# Patient Record
Sex: Female | Born: 1970 | Race: Black or African American | Hispanic: No | Smoking: Never smoker
Health system: Southern US, Community
[De-identification: ages and names within clinical notes are randomized; demographics above are authoritative.]

## PROBLEM LIST (undated history)

## (undated) DIAGNOSIS — I10 Essential (primary) hypertension: Secondary | ICD-10-CM

## (undated) DIAGNOSIS — E785 Hyperlipidemia, unspecified: Secondary | ICD-10-CM

## (undated) DIAGNOSIS — D573 Sickle-cell trait: Secondary | ICD-10-CM

## (undated) DIAGNOSIS — M1990 Unspecified osteoarthritis, unspecified site: Secondary | ICD-10-CM

## (undated) DIAGNOSIS — R011 Cardiac murmur, unspecified: Secondary | ICD-10-CM

## (undated) HISTORY — DX: Cardiac murmur, unspecified: R01.1

## (undated) HISTORY — DX: Hyperlipidemia, unspecified: E78.5

## (undated) HISTORY — DX: Unspecified osteoarthritis, unspecified site: M19.90

---

## 1998-08-07 ENCOUNTER — Emergency Department (HOSPITAL_COMMUNITY): Admission: EM | Admit: 1998-08-07 | Discharge: 1998-08-07 | Payer: Self-pay | Admitting: Emergency Medicine

## 1998-09-05 ENCOUNTER — Other Ambulatory Visit: Admission: RE | Admit: 1998-09-05 | Discharge: 1998-09-05 | Payer: Self-pay | Admitting: *Deleted

## 1999-09-17 ENCOUNTER — Other Ambulatory Visit: Admission: RE | Admit: 1999-09-17 | Discharge: 1999-09-17 | Payer: Self-pay | Admitting: Obstetrics & Gynecology

## 2000-12-16 ENCOUNTER — Other Ambulatory Visit: Admission: RE | Admit: 2000-12-16 | Discharge: 2000-12-16 | Payer: Self-pay | Admitting: Obstetrics and Gynecology

## 2002-01-05 ENCOUNTER — Other Ambulatory Visit: Admission: RE | Admit: 2002-01-05 | Discharge: 2002-01-05 | Payer: Self-pay | Admitting: Obstetrics & Gynecology

## 2003-09-23 ENCOUNTER — Emergency Department (HOSPITAL_COMMUNITY): Admission: EM | Admit: 2003-09-23 | Discharge: 2003-09-23 | Payer: Self-pay | Admitting: *Deleted

## 2003-11-17 ENCOUNTER — Emergency Department (HOSPITAL_COMMUNITY): Admission: EM | Admit: 2003-11-17 | Discharge: 2003-11-17 | Payer: Self-pay | Admitting: Emergency Medicine

## 2004-07-09 ENCOUNTER — Other Ambulatory Visit: Admission: RE | Admit: 2004-07-09 | Discharge: 2004-07-09 | Payer: Self-pay | Admitting: Obstetrics and Gynecology

## 2005-04-09 ENCOUNTER — Emergency Department (HOSPITAL_COMMUNITY): Admission: EM | Admit: 2005-04-09 | Discharge: 2005-04-09 | Payer: Self-pay | Admitting: Emergency Medicine

## 2005-05-10 ENCOUNTER — Encounter: Admission: RE | Admit: 2005-05-10 | Discharge: 2005-05-10 | Payer: Self-pay | Admitting: Family Medicine

## 2005-10-17 ENCOUNTER — Encounter: Admission: RE | Admit: 2005-10-17 | Discharge: 2005-10-17 | Payer: Self-pay | Admitting: Family Medicine

## 2005-11-01 ENCOUNTER — Other Ambulatory Visit: Admission: RE | Admit: 2005-11-01 | Discharge: 2005-11-01 | Payer: Self-pay | Admitting: Obstetrics and Gynecology

## 2008-02-08 ENCOUNTER — Emergency Department (HOSPITAL_COMMUNITY): Admission: EM | Admit: 2008-02-08 | Discharge: 2008-02-08 | Payer: Self-pay | Admitting: Emergency Medicine

## 2008-06-26 ENCOUNTER — Emergency Department (HOSPITAL_COMMUNITY): Admission: EM | Admit: 2008-06-26 | Discharge: 2008-06-26 | Payer: Self-pay | Admitting: Emergency Medicine

## 2010-07-02 ENCOUNTER — Emergency Department (HOSPITAL_COMMUNITY): Admission: EM | Admit: 2010-07-02 | Discharge: 2010-07-02 | Payer: Self-pay | Admitting: Emergency Medicine

## 2011-07-01 LAB — URINALYSIS, ROUTINE W REFLEX MICROSCOPIC
Protein, ur: NEGATIVE
Urobilinogen, UA: 0.2

## 2011-07-01 LAB — RPR: RPR Ser Ql: NONREACTIVE

## 2011-07-01 LAB — WET PREP, GENITAL: Yeast Wet Prep HPF POC: NONE SEEN

## 2011-07-01 LAB — PREGNANCY, URINE: Preg Test, Ur: NEGATIVE

## 2012-08-03 ENCOUNTER — Other Ambulatory Visit: Payer: Self-pay | Admitting: Endocrinology

## 2012-08-03 DIAGNOSIS — E049 Nontoxic goiter, unspecified: Secondary | ICD-10-CM

## 2012-08-14 ENCOUNTER — Ambulatory Visit
Admission: RE | Admit: 2012-08-14 | Discharge: 2012-08-14 | Disposition: A | Discharge status: Home / Self Care | Payer: BC Managed Care – PPO | Source: Ambulatory Visit | Attending: Endocrinology | Admitting: Endocrinology

## 2012-08-14 DIAGNOSIS — E049 Nontoxic goiter, unspecified: Secondary | ICD-10-CM

## 2013-06-23 ENCOUNTER — Other Ambulatory Visit: Payer: Self-pay | Admitting: Endocrinology

## 2013-06-23 DIAGNOSIS — E010 Iodine-deficiency related diffuse (endemic) goiter: Secondary | ICD-10-CM

## 2013-11-25 ENCOUNTER — Other Ambulatory Visit: Payer: Self-pay | Admitting: Obstetrics and Gynecology

## 2013-11-25 DIAGNOSIS — R9230 Dense breasts, unspecified: Secondary | ICD-10-CM

## 2013-11-25 DIAGNOSIS — R922 Inconclusive mammogram: Secondary | ICD-10-CM

## 2013-12-06 ENCOUNTER — Other Ambulatory Visit: Payer: BC Managed Care – PPO

## 2013-12-16 ENCOUNTER — Ambulatory Visit
Admission: RE | Admit: 2013-12-16 | Discharge: 2013-12-16 | Disposition: A | Discharge status: Home / Self Care | Payer: BC Managed Care – PPO | Source: Ambulatory Visit | Attending: Obstetrics and Gynecology | Admitting: Obstetrics and Gynecology

## 2013-12-16 DIAGNOSIS — R922 Inconclusive mammogram: Secondary | ICD-10-CM

## 2013-12-16 DIAGNOSIS — R9230 Dense breasts, unspecified: Secondary | ICD-10-CM

## 2013-12-16 MED ORDER — GADOBENATE DIMEGLUMINE 529 MG/ML IV SOLN
15.0000 mL | Freq: Once | INTRAVENOUS | Status: AC | PRN
  Administered 2013-12-16: 15 mL via INTRAVENOUS

## 2013-12-17 ENCOUNTER — Other Ambulatory Visit: Payer: Self-pay | Admitting: Obstetrics and Gynecology

## 2013-12-17 DIAGNOSIS — R928 Other abnormal and inconclusive findings on diagnostic imaging of breast: Secondary | ICD-10-CM

## 2013-12-20 ENCOUNTER — Ambulatory Visit
Admission: RE | Admit: 2013-12-20 | Discharge: 2013-12-20 | Disposition: A | Discharge status: Home / Self Care | Payer: BC Managed Care – PPO | Source: Ambulatory Visit | Attending: Obstetrics and Gynecology | Admitting: Obstetrics and Gynecology

## 2013-12-20 ENCOUNTER — Other Ambulatory Visit: Payer: Self-pay | Admitting: Obstetrics and Gynecology

## 2013-12-20 DIAGNOSIS — R928 Other abnormal and inconclusive findings on diagnostic imaging of breast: Secondary | ICD-10-CM

## 2014-01-17 ENCOUNTER — Other Ambulatory Visit: Payer: BC Managed Care – PPO

## 2014-01-18 ENCOUNTER — Ambulatory Visit
Admission: RE | Admit: 2014-01-18 | Discharge: 2014-01-18 | Disposition: A | Discharge status: Home / Self Care | Payer: BC Managed Care – PPO | Source: Ambulatory Visit | Attending: Obstetrics and Gynecology | Admitting: Obstetrics and Gynecology

## 2014-01-18 ENCOUNTER — Other Ambulatory Visit (HOSPITAL_COMMUNITY): Payer: Self-pay | Admitting: Diagnostic Radiology

## 2014-01-18 DIAGNOSIS — R928 Other abnormal and inconclusive findings on diagnostic imaging of breast: Secondary | ICD-10-CM

## 2014-01-18 MED ORDER — GADOBENATE DIMEGLUMINE 529 MG/ML IV SOLN
15.0000 mL | Freq: Once | INTRAVENOUS | Status: AC | PRN
  Administered 2014-01-18: 15 mL via INTRAVENOUS

## 2014-09-19 ENCOUNTER — Other Ambulatory Visit (HOSPITAL_COMMUNITY): Payer: Self-pay | Admitting: Cardiology

## 2014-09-19 DIAGNOSIS — R011 Cardiac murmur, unspecified: Secondary | ICD-10-CM

## 2014-09-19 DIAGNOSIS — I159 Secondary hypertension, unspecified: Secondary | ICD-10-CM

## 2014-10-03 ENCOUNTER — Encounter (INDEPENDENT_AMBULATORY_CARE_PROVIDER_SITE_OTHER): Payer: Self-pay

## 2014-10-03 ENCOUNTER — Ambulatory Visit (HOSPITAL_COMMUNITY)
Admission: RE | Admit: 2014-10-03 | Discharge: 2014-10-03 | Disposition: A | Discharge status: Home / Self Care | Payer: BLUE CROSS/BLUE SHIELD | Source: Ambulatory Visit | Attending: Cardiology | Admitting: Cardiology

## 2014-10-03 ENCOUNTER — Other Ambulatory Visit (HOSPITAL_COMMUNITY): Payer: Self-pay | Admitting: Cardiology

## 2014-10-03 DIAGNOSIS — M79672 Pain in left foot: Secondary | ICD-10-CM

## 2014-10-03 DIAGNOSIS — R011 Cardiac murmur, unspecified: Secondary | ICD-10-CM

## 2014-10-03 DIAGNOSIS — R0600 Dyspnea, unspecified: Secondary | ICD-10-CM | POA: Insufficient documentation

## 2014-10-03 LAB — COMPREHENSIVE METABOLIC PANEL
ALBUMIN: 4.1 g/dL (ref 3.5–5.2)
ALT: 12 U/L (ref 0–35)
ANION GAP: 7 (ref 5–15)
AST: 15 U/L (ref 0–37)
Alkaline Phosphatase: 60 U/L (ref 39–117)
BUN: 7 mg/dL (ref 6–23)
CALCIUM: 9 mg/dL (ref 8.4–10.5)
CO2: 27 mmol/L (ref 19–32)
CREATININE: 0.73 mg/dL (ref 0.50–1.10)
Chloride: 105 mEq/L (ref 96–112)
GFR calc Af Amer: >90 mL/min (ref >90)
Glucose, Bld: 95 mg/dL (ref 70–99)
Potassium: 3.6 mmol/L (ref 3.5–5.1)
Sodium: 139 mmol/L (ref 135–145)
Total Bilirubin: 0.9 mg/dL (ref 0.3–1.2)
Total Protein: 7.2 g/dL (ref 6.0–8.3)

## 2014-10-03 LAB — LIPID PANEL
Cholesterol: 229 mg/dL — ABNORMAL HIGH (ref 0–200)
HDL: 76 mg/dL (ref >39)
LDL Cholesterol: 143 mg/dL — ABNORMAL HIGH (ref 0–99)
TRIGLYCERIDES: 51 mg/dL (ref <150)
Total CHOL/HDL Ratio: 3 RATIO
VLDL: 10 mg/dL (ref 0–40)

## 2014-10-03 LAB — CBC
HEMATOCRIT: 36.8 % (ref 36.0–46.0)
Hemoglobin: 12.3 g/dL (ref 12.0–15.0)
MCH: 28.5 pg (ref 26.0–34.0)
MCHC: 33.4 g/dL (ref 30.0–36.0)
MCV: 85.4 fL (ref 78.0–100.0)
Platelets: 309 10*3/uL (ref 150–400)
RBC: 4.31 MIL/uL (ref 3.87–5.11)
RDW: 13.4 % (ref 11.5–15.5)
WBC: 5.4 10*3/uL (ref 4.0–10.5)

## 2014-10-03 LAB — URINALYSIS, ROUTINE W REFLEX MICROSCOPIC
Bilirubin Urine: NEGATIVE
GLUCOSE, UA: NEGATIVE mg/dL
Hgb urine dipstick: NEGATIVE
Ketones, ur: NEGATIVE mg/dL
LEUKOCYTES UA: NEGATIVE
NITRITE: NEGATIVE
Protein, ur: NEGATIVE mg/dL
Specific Gravity, Urine: 1.014 (ref 1.005–1.030)
UROBILINOGEN UA: 0.2 mg/dL (ref 0.0–1.0)
pH: 6 (ref 5.0–8.0)

## 2014-10-03 LAB — TSH: TSH: 0.744 u[IU]/mL (ref 0.350–4.500)

## 2014-10-03 NOTE — Progress Notes (Signed)
  Echocardiogram 2D Echocardiogram has been performed.  Elysha Daw FRANCES 10/03/2014, 10:46 AM

## 2014-10-04 LAB — VITAMIN D 25 HYDROXY (VIT D DEFICIENCY, FRACTURES): VIT D 25 HYDROXY: 16.7 ng/mL — AB (ref 30.0–100.0)

## 2014-10-05 LAB — HEMOGLOBIN A1C
Hgb A1c MFr Bld: 6 % — ABNORMAL HIGH (ref <5.7)
Mean Plasma Glucose: 126 mg/dL — ABNORMAL HIGH (ref <117)

## 2014-10-05 LAB — MICROALBUMIN, URINE: MICROALB UR: HIGH mg/dL (ref 0.00–1.89)

## 2014-10-05 LAB — T4: T4, Total: 7.8 ug/dL (ref 4.5–12.0)

## 2014-12-21 ENCOUNTER — Other Ambulatory Visit: Payer: Self-pay | Admitting: Endocrinology

## 2014-12-21 DIAGNOSIS — E049 Nontoxic goiter, unspecified: Secondary | ICD-10-CM

## 2014-12-23 ENCOUNTER — Other Ambulatory Visit: Payer: BLUE CROSS/BLUE SHIELD

## 2014-12-27 ENCOUNTER — Ambulatory Visit
Admission: RE | Admit: 2014-12-27 | Discharge: 2014-12-27 | Disposition: A | Discharge status: Home / Self Care | Payer: BLUE CROSS/BLUE SHIELD | Source: Ambulatory Visit | Attending: Endocrinology | Admitting: Endocrinology

## 2014-12-27 DIAGNOSIS — E049 Nontoxic goiter, unspecified: Secondary | ICD-10-CM

## 2015-01-11 ENCOUNTER — Other Ambulatory Visit: Payer: Self-pay | Admitting: Obstetrics and Gynecology

## 2015-01-13 LAB — CYTOLOGY - PAP

## 2015-12-28 IMAGING — CR DG FOOT COMPLETE 3+V*L*
3 series · 3 of 3 positions shown · non-contrast
Comparison: None.

CLINICAL DATA: Anterior and anterior lateral foot pain for 1 year,
progressive.

EXAM:
LEFT FOOT - COMPLETE 3+ VIEW

[foot ap]
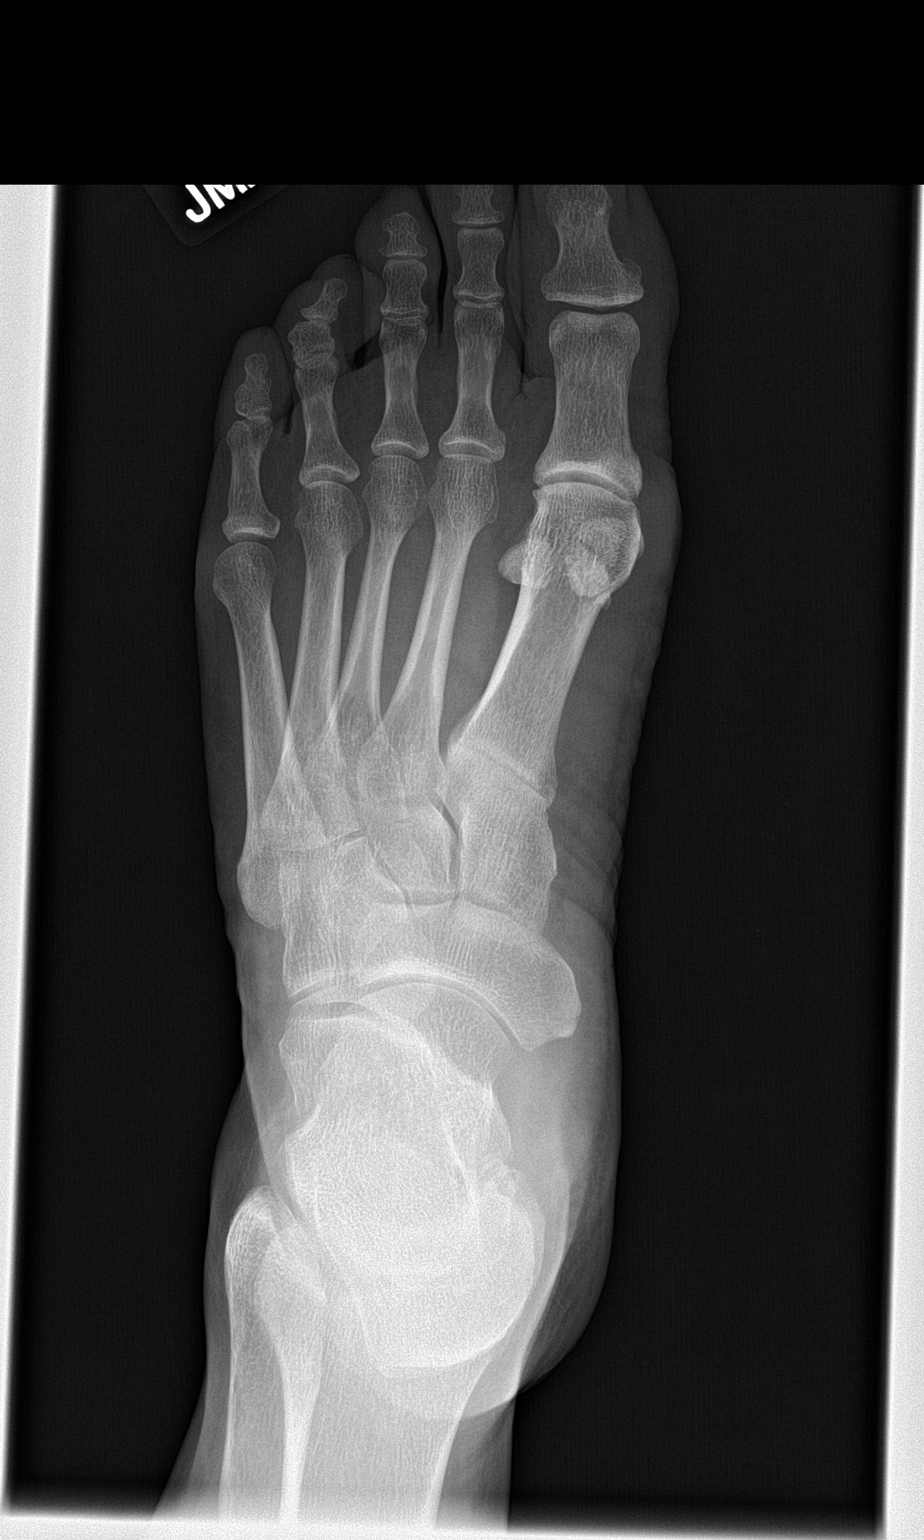

[foot obl]
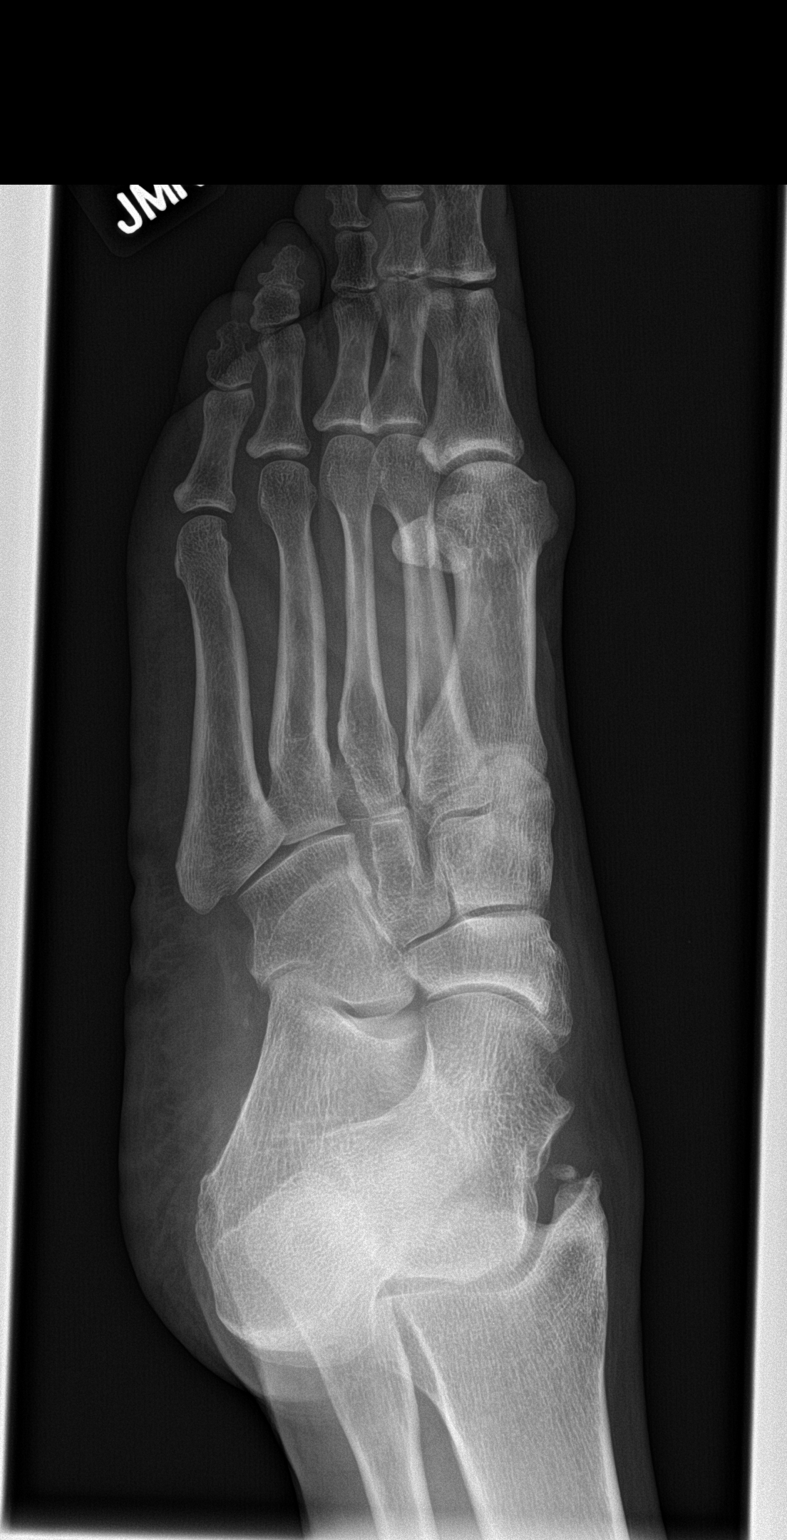

[foot lat]
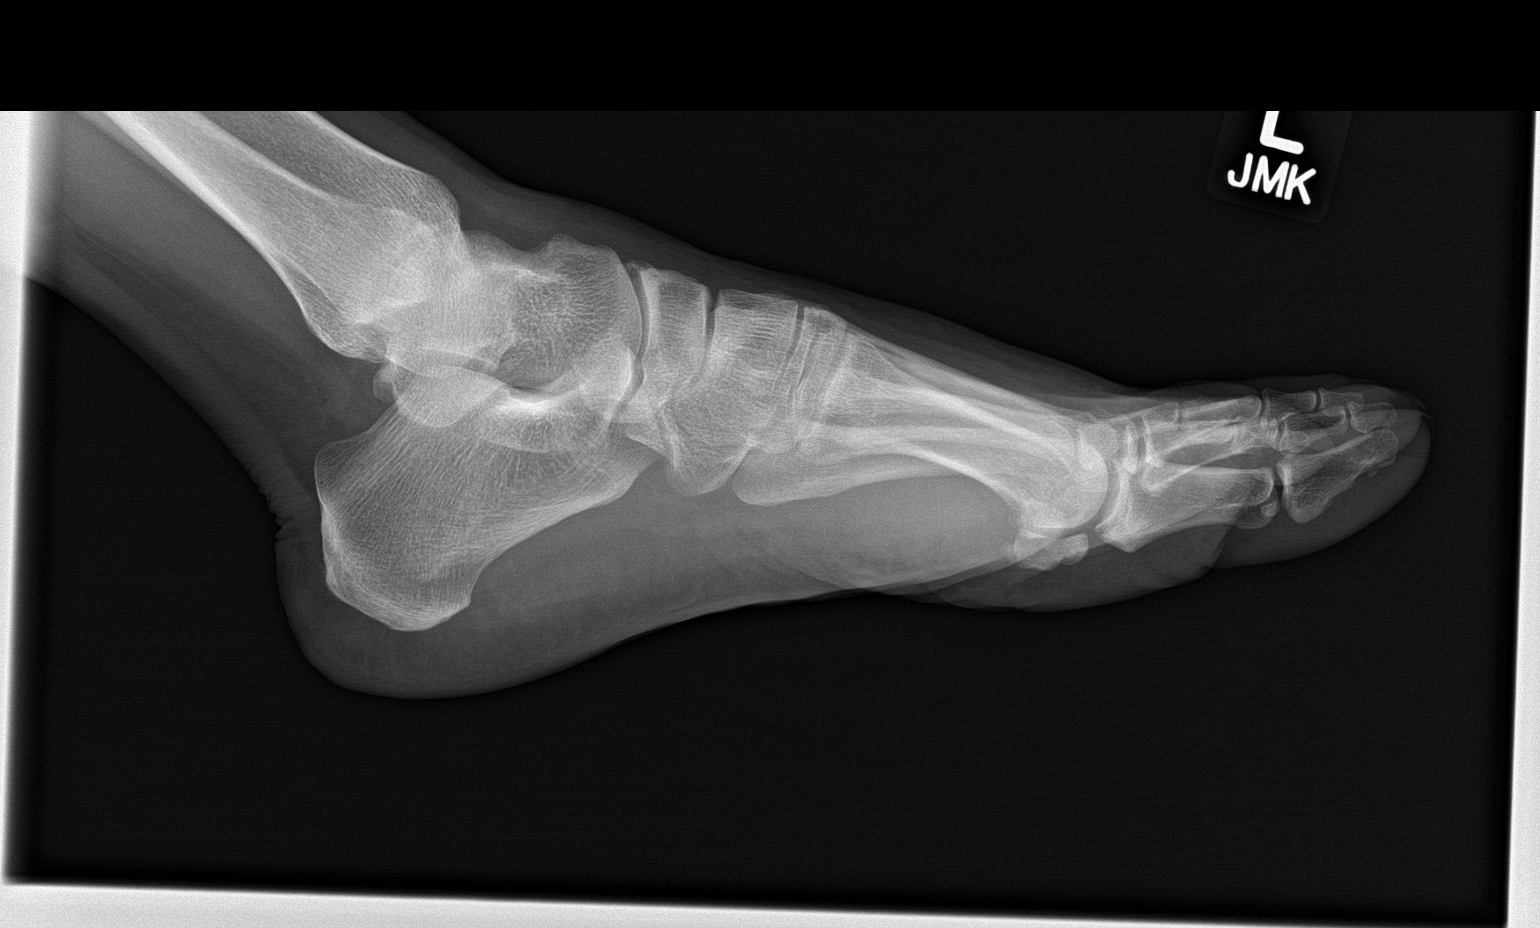

[3 of 3 positions shown; findings below may reference images not displayed]

FINDINGS: There is no fracture or dislocation. There is minimal dorsal
spurring on the distal talus and on the navicular. There is bunion
formation on the head of the first metatarsal with slight
degenerative changes at the first metatarsal phalangeal joint.
IMPRESSION: Minimal degenerative changes.

## 2016-10-02 ENCOUNTER — Other Ambulatory Visit: Payer: Self-pay | Admitting: Endocrinology

## 2016-10-02 DIAGNOSIS — E049 Nontoxic goiter, unspecified: Secondary | ICD-10-CM

## 2016-10-04 ENCOUNTER — Ambulatory Visit
Admission: RE | Admit: 2016-10-04 | Discharge: 2016-10-04 | Disposition: A | Discharge status: Home / Self Care | Payer: BLUE CROSS/BLUE SHIELD | Source: Ambulatory Visit | Attending: Endocrinology | Admitting: Endocrinology

## 2016-10-04 DIAGNOSIS — E049 Nontoxic goiter, unspecified: Secondary | ICD-10-CM

## 2017-12-05 ENCOUNTER — Emergency Department (HOSPITAL_COMMUNITY): Payer: BLUE CROSS/BLUE SHIELD

## 2017-12-05 ENCOUNTER — Emergency Department (HOSPITAL_COMMUNITY)
Admission: EM | Admit: 2017-12-05 | Discharge: 2017-12-05 | Disposition: A | Discharge status: Home / Self Care | Payer: BLUE CROSS/BLUE SHIELD | Attending: Emergency Medicine | Admitting: Emergency Medicine

## 2017-12-05 ENCOUNTER — Encounter (HOSPITAL_COMMUNITY): Payer: Self-pay

## 2017-12-05 DIAGNOSIS — R079 Chest pain, unspecified: Secondary | ICD-10-CM | POA: Diagnosis present

## 2017-12-05 DIAGNOSIS — Z79899 Other long term (current) drug therapy: Secondary | ICD-10-CM | POA: Diagnosis not present

## 2017-12-05 DIAGNOSIS — R0789 Other chest pain: Secondary | ICD-10-CM

## 2017-12-05 DIAGNOSIS — I10 Essential (primary) hypertension: Secondary | ICD-10-CM | POA: Diagnosis not present

## 2017-12-05 HISTORY — DX: Essential (primary) hypertension: I10

## 2017-12-05 HISTORY — DX: Sickle-cell trait: D57.3

## 2017-12-05 LAB — BASIC METABOLIC PANEL
Anion gap: 8 (ref 5–15)
BUN: 12 mg/dL (ref 6–20)
CO2: 25 mmol/L (ref 22–32)
Calcium: 9 mg/dL (ref 8.9–10.3)
Chloride: 105 mmol/L (ref 101–111)
Creatinine, Ser: 0.67 mg/dL (ref 0.44–1.00)
GFR calc Af Amer: >60 mL/min (ref >60)
GFR calc non Af Amer: >60 mL/min (ref >60)
Glucose, Bld: 93 mg/dL (ref 65–99)
Potassium: 3.6 mmol/L (ref 3.5–5.1)
Sodium: 138 mmol/L (ref 135–145)

## 2017-12-05 LAB — I-STAT BETA HCG BLOOD, ED (MC, WL, AP ONLY): I-stat hCG, quantitative: <5 m[IU]/mL (ref <5)

## 2017-12-05 LAB — CBC
HCT: 36.5 % (ref 36.0–46.0)
HEMOGLOBIN: 12.3 g/dL (ref 12.0–15.0)
MCH: 29.4 pg (ref 26.0–34.0)
MCHC: 33.7 g/dL (ref 30.0–36.0)
MCV: 87.1 fL (ref 78.0–100.0)
PLATELETS: 359 10*3/uL (ref 150–400)
RBC: 4.19 MIL/uL (ref 3.87–5.11)
RDW: 13.1 % (ref 11.5–15.5)
WBC: 4.9 10*3/uL (ref 4.0–10.5)

## 2017-12-05 LAB — I-STAT TROPONIN, ED: Troponin i, poc: 0 ng/mL (ref 0.00–0.08)

## 2017-12-05 LAB — D-DIMER, QUANTITATIVE: D-Dimer, Quant: 1.05 ug/mL-FEU — ABNORMAL HIGH (ref 0.00–0.50)

## 2017-12-05 MED ORDER — SODIUM CHLORIDE 0.9 % IJ SOLN
INTRAMUSCULAR | Status: AC
  Filled 2017-12-05: qty 50

## 2017-12-05 MED ORDER — HYDROCODONE-ACETAMINOPHEN 5-325 MG PO TABS
1.0000 | ORAL_TABLET | Freq: Once | ORAL | Status: DC
  Filled 2017-12-05: qty 1

## 2017-12-05 MED ORDER — ASPIRIN 81 MG PO CHEW
324.0000 mg | CHEWABLE_TABLET | Freq: Once | ORAL | Status: AC
  Administered 2017-12-05: 324 mg via ORAL
  Filled 2017-12-05: qty 4

## 2017-12-05 MED ORDER — BACLOFEN 10 MG PO TABS: 10.0000 mg | ORAL_TABLET | Freq: Three times a day (TID) | ORAL | 0 refills | Status: AC | PRN

## 2017-12-05 MED ORDER — MELOXICAM 15 MG PO TABS: 15.0000 mg | ORAL_TABLET | Freq: Every day | ORAL | 0 refills | Status: AC

## 2017-12-05 MED ORDER — IOPAMIDOL (ISOVUE-370) INJECTION 76%
INTRAVENOUS | Status: AC
  Administered 2017-12-05: 100 mL
  Filled 2017-12-05: qty 100

## 2017-12-05 NOTE — ED Notes (Signed)
Patient transported to CT 

## 2017-12-05 NOTE — Discharge Instructions (Signed)
Your CT scan was negative and you do not appear to have an emergent cause of your chest wall pain.  You have been diagnosed by your caregiver as having chest wall pain. SEEK IMMEDIATE MEDICAL ATTENTION IF: You develop a fever.  Your chest pains become severe or intolerable.  You develop new, unexplained symptoms (problems).  You develop shortness of breath, nausea, vomiting, sweating or feel light headed.  You develop a new cough or you cough up blood.

## 2017-12-05 NOTE — ED Triage Notes (Signed)
Patient reports left side pain from shoulder to abdomen for the past two weeks. Pt states it is worse when she lays flat. She does not c/o Gpddc LLCHOB or chest pain. Pt report she does not have cardiac hx. Her pain is worse today, 7/10, which brought her to ED today.

## 2017-12-05 NOTE — ED Provider Notes (Signed)
[facility]-EMERGENCY DEPT Provider Note   CSN: [id number] Arrival date & time: 12/05/17  1149     History   Chief Complaint Chief Complaint  Patient presents with  . Left Side Pain    HPI Renee Russell is a 47 y.o. female who presents emergency department chief complaint of left chest pain.  Patient states that she woke up with the pain on the left lateral side of her chest 2 weeks ago.  The pain is worse with coughing, laughing, deep breathing, certain movements  And, Ima and lying flat.  It is improved when sitting still, breathing shallowly, taking over-the-counter analgesics.  Patient states that she has had intermittent sensation of feeling short of breath when lying flat however this does not occur every time she lies flat.  She denies exertional dyspnea, hemoptysis.  She is a history of chronic left lower ankle swelling.  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  She denies exogenous estrogen use, recent travel, smoking.  She has no contributing past medical history.  HPI  Past Medical History:  Diagnosis Date  . Hypertension   . Sickle cell trait (HCC)     There are no active problems to display for this patient.   History reviewed. No pertinent surgical history.  OB History    No data available       Home Medications    Prior to Admission medications   Medication Sig Start Date End Date Taking? Authorizing Provider  co-enzyme Q-10 30 MG capsule Take 100 mg by mouth daily.   Yes [provider]  Multiple Vitamins-Minerals (MULTIVITAMIN WITH MINERALS) tablet Take 1 tablet by mouth daily.   Yes [provider]  baclofen (LIORESAL) 10 MG tablet Take 1 tablet (10 mg total) by mouth 3 (three) times daily as needed for muscle spasms. 12/05/17   Arthor Captain, PA-C  meloxicam (MOBIC) 15 MG tablet Take 1 tablet (15 mg total) by mouth daily. 12/05/17   Arthor Captain, PA-C    Family  History No family history on file.  Social History Social History   Tobacco Use  . Smoking status: Never Smoker  . Smokeless tobacco: Never Used  Substance Use Topics  . Alcohol use: Yes    Frequency: Never    Comment: occasional   . Drug use: No     Allergies   Other   Review of Systems Review of Systems Ten systems reviewed and are negative for acute change, except as noted in the HPI.     Physical Exam Updated Vital Signs BP (!) 143/86 (BP Location: Left Arm)   Pulse 87   Temp (!) 97.5 F (36.4 C) (Oral)   Resp 16   LMP 11/21/2017   SpO2 100%   Physical Exam Physical Exam  Nursing note and vitals reviewed. Constitutional: She is oriented to person, place, and time. She appears well-developed and well-nourished. No distress.  HENT:  Head: Normocephalic and atraumatic.  Eyes: Conjunctivae normal and EOM are normal. Pupils are equal, round, and reactive to light. No scleral icterus.  Neck: Normal range of motion.  Cardiovascular: Normal rate, regular rhythm and normal heart sounds.  Exam reveals no gallop and no friction rub.   No murmur heard. Pulmonary/Chest: Effort normal and breath sounds normal. No respiratory distress.  Abdominal: Soft. Bowel sounds are normal. She exhibits no distension and no mass. There is no tenderness. There is no guarding.  Neurological: She  is alert and oriented to person, place, and time.  Musculoskeletal: Tender to palpation along the left mid axillary chest wall especially intercostal palpation, palpation along the serratus anterior and left lateral latissimus dorsi. Skin: Skin is warm and dry. She is not diaphoretic.     ED Treatments / Results  Labs (all labs ordered are listed, but only abnormal results are displayed) Labs Reviewed  D-DIMER, QUANTITATIVE (NOT AT [facility]) - Abnormal; Notable for the following components:      Result Value   D-Dimer, Quant 1.05 (*)    All other components within normal limits  BASIC  METABOLIC PANEL  CBC  I-STAT TROPONIN, ED  I-STAT BETA HCG BLOOD, ED ([facility], [facility], [facility] ONLY)    EKG  EKG Interpretation None      ED ECG REPORT   Date: 12/05/2017  Rate: 91  Rhythm: normal sinus rhythm  QRS Axis: normal  Intervals: normal  ST/T Wave abnormalities: normal  Conduction Disutrbances:none  Narrative Interpretation:   Old EKG Reviewed: none available  I have personally reviewed the EKG tracing and agree with the computerized printout as noted.  Radiology Dg Chest 2 View  Result Date: 12/05/2017 CLINICAL DATA:  Chest pain. EXAM: CHEST - 2 VIEW COMPARISON:  Radiographs of May 10, 2005. FINDINGS: The heart size and mediastinal contours are within normal limits. Both lungs are clear. No pneumothorax or pleural effusion is noted. The visualized skeletal structures are unremarkable. IMPRESSION: No active cardiopulmonary disease. Electronically Signed   By: Lupita Raider, M.D.   On: 12/05/2017 14:28   Ct Angio Chest Pe W And/or Wo Contrast  Result Date: 12/05/2017 CLINICAL DATA:  Left-sided chest pain without shortness of Breath EXAM: CT ANGIOGRAPHY CHEST WITH CONTRAST TECHNIQUE: Multidetector CT imaging of the chest was performed using the standard protocol during bolus administration of intravenous contrast. Multiplanar CT image reconstructions and MIPs were obtained to evaluate the vascular anatomy. CONTRAST:  ISOVUE-370 IOPAMIDOL (ISOVUE-370) INJECTION 76% COMPARISON:  10/17/2005 FINDINGS: Cardiovascular: Thoracic aorta is within normal limits without aneurysmal dilatation or dissection. Heart is at the upper limits of normal. No coronary calcifications are seen. The pulmonary artery shows a normal branching pattern. No definitive filling defect to suggest pulmonary embolism is identified. Mediastinum/Nodes: The thoracic inlet is within normal limits. No hilar or mediastinal adenopathy is noted. The esophagus is within normal limits. Lungs/Pleura: Lungs are well aerated  bilaterally. No focal infiltrate or sizable effusion is seen. No pneumothorax is noted. No parenchymal nodules are seen. Minimal lingular scarring is noted stable from the prior exam. Upper Abdomen: Visualized upper abdomen is unremarkable. Musculoskeletal: No chest wall abnormality. No acute or significant osseous findings. Review of the MIP images confirms the above findings. IMPRESSION: No evidence of pulmonary emboli. Lingular scarring. No acute abnormality noted. Electronically Signed   By: Alcide Clever M.D.   On: 12/05/2017 16:01    Procedures Procedures (including critical care time)  Medications Ordered in ED Medications  aspirin chewable tablet 324 mg (324 mg Oral Given 12/05/17 1347)  iopamidol (ISOVUE-370) 76 % injection (100 mLs  Contrast Given 12/05/17 1541)     Initial Impression / Assessment and Plan / ED Course  I have reviewed the triage vital signs and the nursing notes.  Pertinent labs & imaging results that were available during my care of the patient were reviewed by me and considered in my medical decision making (see chart for details).  Clinical Course as of Dec 05 2245  Fri Dec 05, 2017  [id number] Patient with 2 weeks of lateral chest wall pain.  I suspect that this is musculoskeletal however patient has a documented heart rate of 102 and I am unable to use pulmonary embolus rule out criteria.  She is Wells low risk and I will order a d-dimer.  I doubt acute coronary syndrome.  [AH]    Clinical Course User Index [AH] Arthor CaptainHarris, Cherene Dobbins, PA-C   Patient EKG unremarkable.  Chest x-ray without abnormality.  Risk Wells criteria however I was unable to use PERC rule out.  Patient's d-dimer elevated.  Her CT angios is negative for any acute abnormalities or pulmonary emboli.  This is likely musculoskeletal pain.  Patient be discharged with Mobic Mobic and baclofen.  She is instructed to follow closely with her primary care physician.  Final Clinical Impressions(s) / ED Diagnoses    Final diagnoses:  Chest wall pain    ED Discharge Orders        Ordered    meloxicam (MOBIC) 15 MG tablet  Daily     12/05/17 1610    baclofen (LIORESAL) 10 MG tablet  3 times daily PRN     12/05/17 1610       Arthor CaptainHarris, Demarkis Gheen, PA-C 12/05/17 2247    Arby BarrettePfeiffer, Marcy, MD 12/06/17 [phone]

## 2018-11-20 ENCOUNTER — Other Ambulatory Visit: Payer: Self-pay | Admitting: Endocrinology

## 2018-11-20 DIAGNOSIS — E049 Nontoxic goiter, unspecified: Secondary | ICD-10-CM

## 2018-11-26 ENCOUNTER — Ambulatory Visit
Admission: RE | Admit: 2018-11-26 | Discharge: 2018-11-26 | Disposition: A | Discharge status: Home / Self Care | Payer: BLUE CROSS/BLUE SHIELD | Source: Ambulatory Visit | Attending: Endocrinology | Admitting: Endocrinology

## 2018-11-26 DIAGNOSIS — E049 Nontoxic goiter, unspecified: Secondary | ICD-10-CM

## 2019-03-01 IMAGING — CT CT ANGIO CHEST
2 of 6 series · 18 of 46 positions shown · IV contrast (ISOVUE)
Comparison: 10/17/2005

CLINICAL DATA: Left-sided chest pain without shortness of Breath

EXAM:
CT ANGIOGRAPHY CHEST WITH CONTRAST
TECHNIQUE: Multidetector CT imaging of the chest was performed using the
standard protocol during bolus administration of intravenous
contrast. Multiplanar CT image reconstructions and MIPs were
obtained to evaluate the vascular anatomy.
CONTRAST:  100mL [id number] IOPAMIDOL ([id number]) INJECTION 76%

[Series 5: thins · axial · 0.65mm/px · z∈[-266,-19]mm · 15 of 271 slices shown]
[im 12/271  lung]
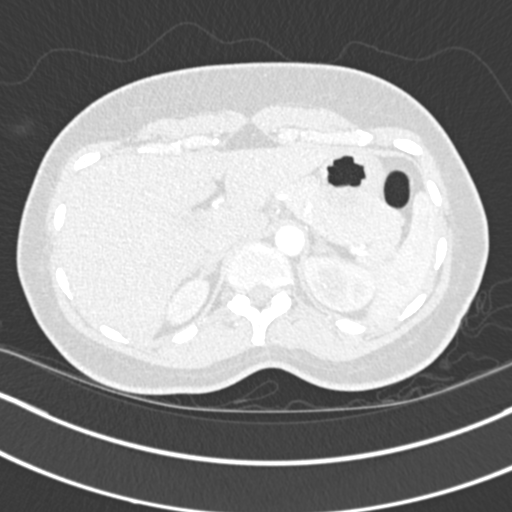
[im 36/271  soft-tissue]
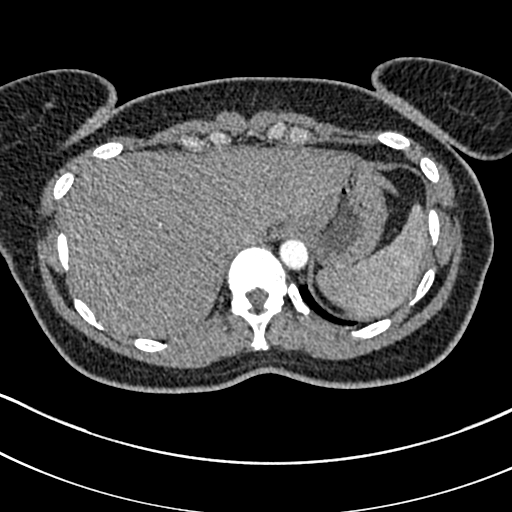
[im 47/271  lung]
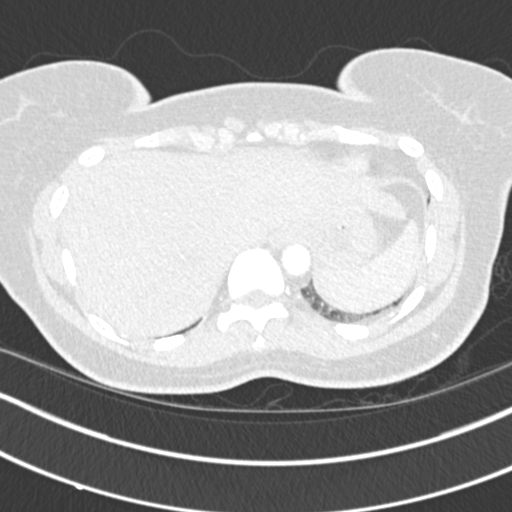
[im 71/271  soft-tissue]
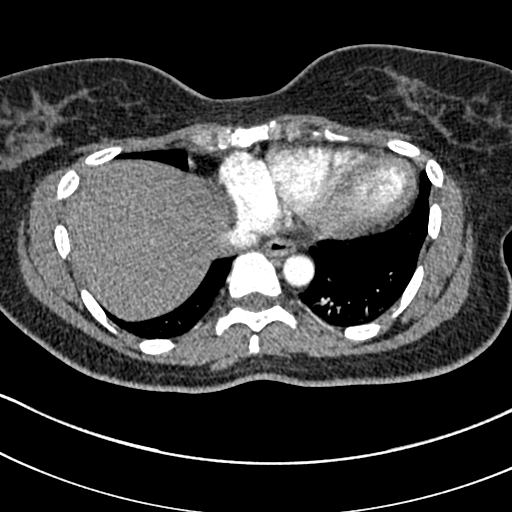
[im 83/271  lung]
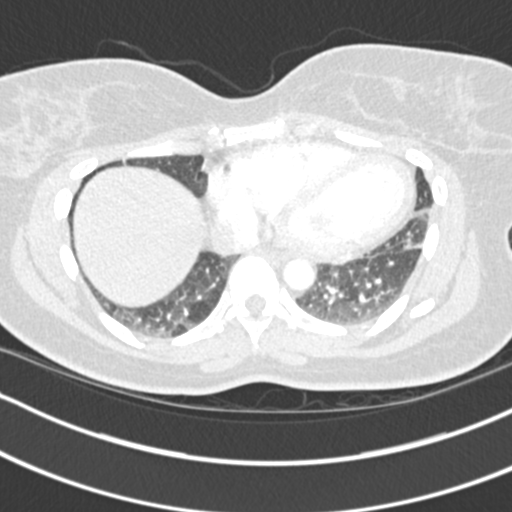
[im 106/271  soft-tissue]
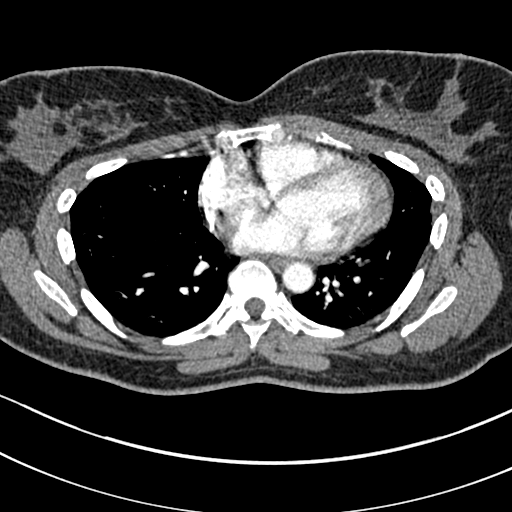
[im 118/271  lung]
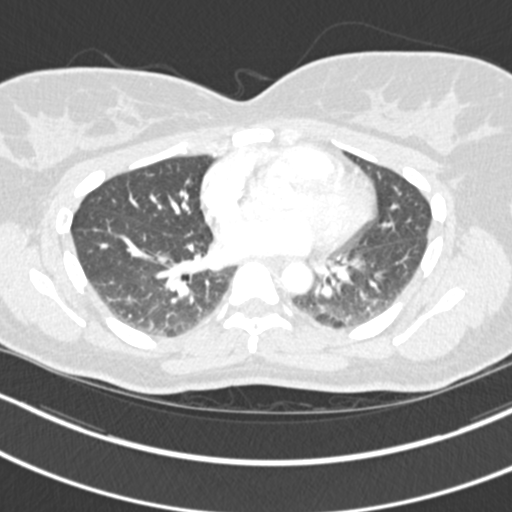
[im 141/271  soft-tissue]
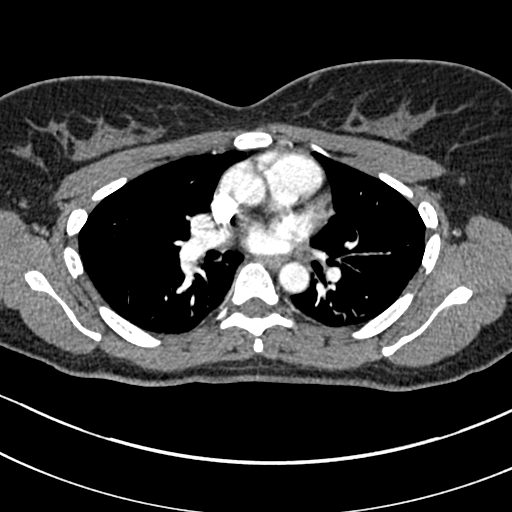
[im 153/271  lung]
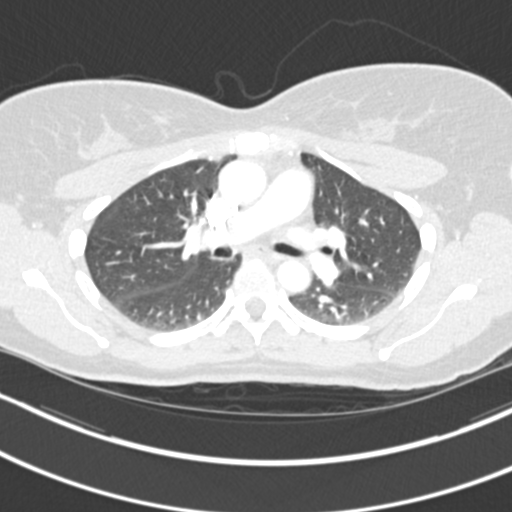
[im 165/271  soft-tissue]
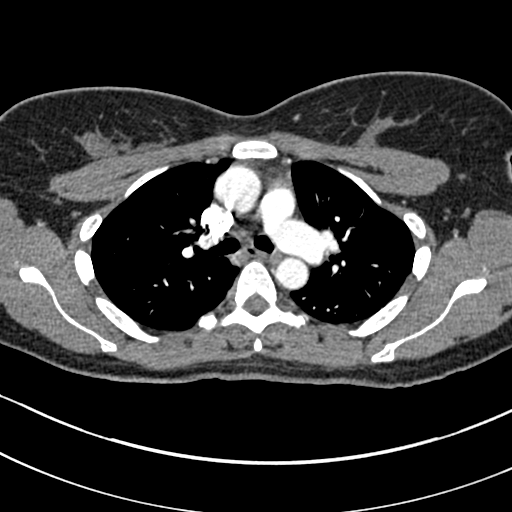
[im 188/271  lung]
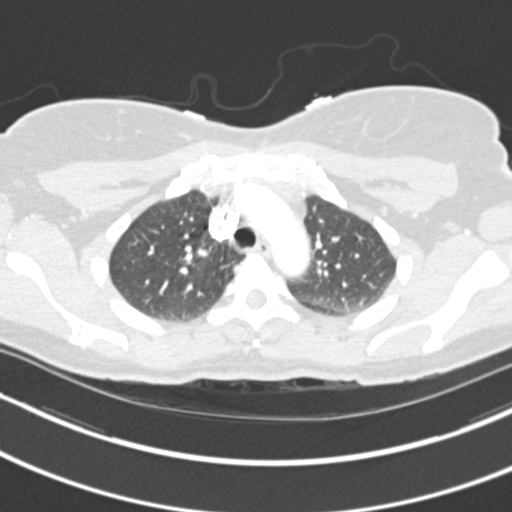
[im 200/271  soft-tissue]
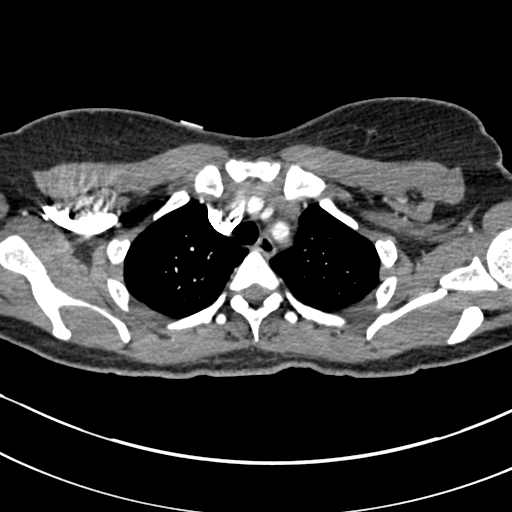
[im 224/271  lung]
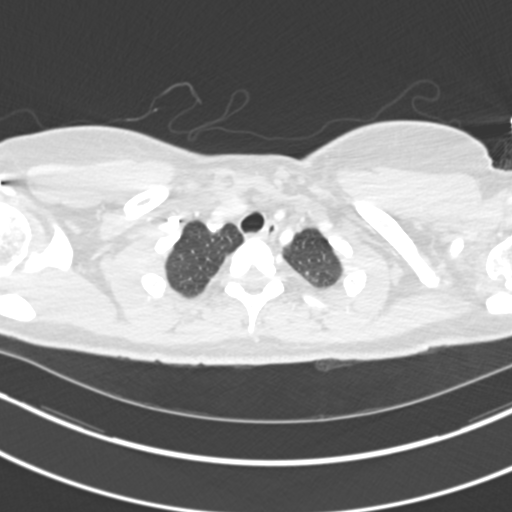
[im 235/271  soft-tissue]
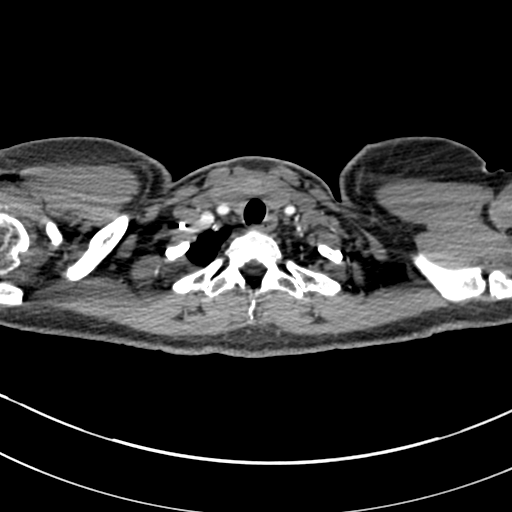
[im 259/271  lung]
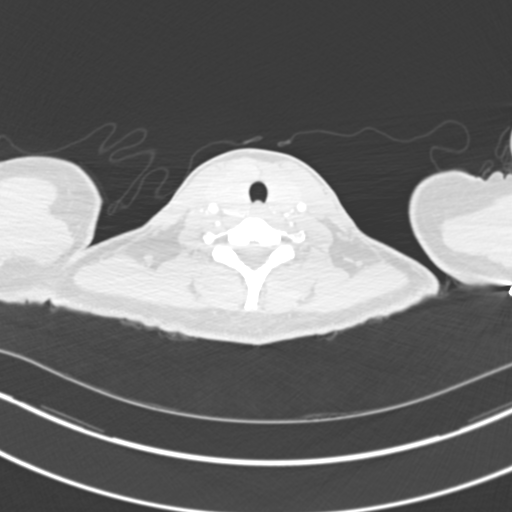

[Series 7: coronal mpr · coronal · 0.57mm/px · 3 of 106 slices shown]
[im 27/106  soft-tissue]
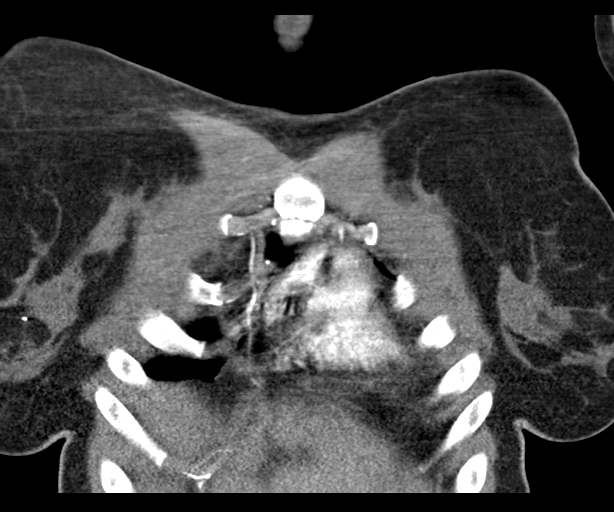
[im 53/106  soft-tissue]
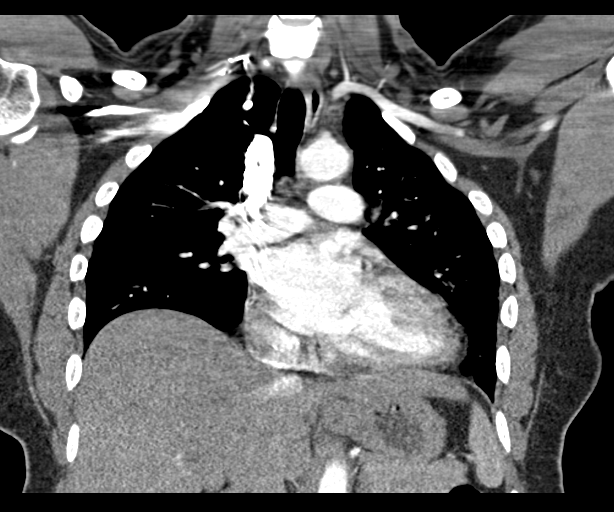
[im 79/106  soft-tissue]
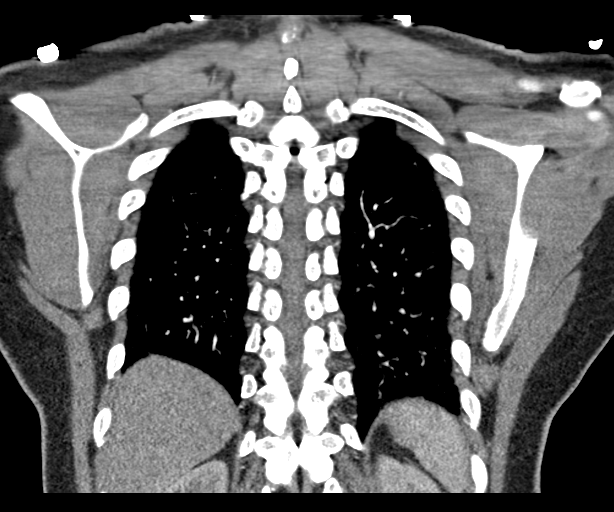

[18 of 46 positions shown; findings below may reference images not displayed]

FINDINGS: Cardiovascular: Thoracic aorta is within normal limits without
aneurysmal dilatation or dissection. Heart is at the upper limits of
normal. No coronary calcifications are seen. The pulmonary artery
shows a normal branching pattern. No definitive filling defect to
suggest pulmonary embolism is identified.

Mediastinum/Nodes: The thoracic inlet is within normal limits. No
hilar or mediastinal adenopathy is noted. The esophagus is within
normal limits.

Lungs/Pleura: Lungs are well aerated bilaterally. No focal
infiltrate or sizable effusion is seen. No pneumothorax is noted. No
parenchymal nodules are seen. Minimal lingular scarring is noted
stable from the prior exam.

Upper Abdomen: Visualized upper abdomen is unremarkable.

Musculoskeletal: No chest wall abnormality. No acute or significant
osseous findings.

Review of the MIP images confirms the above findings.
IMPRESSION: No evidence of pulmonary emboli.

Lingular scarring.

No acute abnormality noted.

## 2019-08-23 ENCOUNTER — Other Ambulatory Visit: Payer: Self-pay

## 2019-08-23 DIAGNOSIS — Z20822 Contact with and (suspected) exposure to covid-19: Secondary | ICD-10-CM

## 2019-08-26 LAB — NOVEL CORONAVIRUS, NAA: SARS-CoV-2, NAA: NOT DETECTED

## 2019-09-09 ENCOUNTER — Encounter: Payer: Self-pay | Admitting: Gastroenterology

## 2019-10-20 ENCOUNTER — Encounter: Payer: BLUE CROSS/BLUE SHIELD | Admitting: Gastroenterology

## 2020-09-27 ENCOUNTER — Other Ambulatory Visit: Payer: Self-pay | Admitting: Endocrinology

## 2020-09-27 DIAGNOSIS — E049 Nontoxic goiter, unspecified: Secondary | ICD-10-CM

## 2020-10-13 ENCOUNTER — Ambulatory Visit
Admission: RE | Admit: 2020-10-13 | Discharge: 2020-10-13 | Disposition: A | Discharge status: Home / Self Care | Payer: BC Managed Care – PPO | Source: Ambulatory Visit | Attending: Endocrinology | Admitting: Endocrinology

## 2020-10-13 DIAGNOSIS — E049 Nontoxic goiter, unspecified: Secondary | ICD-10-CM

## 2020-11-27 ENCOUNTER — Ambulatory Visit (AMBULATORY_SURGERY_CENTER): Payer: Self-pay

## 2020-11-27 ENCOUNTER — Other Ambulatory Visit: Payer: Self-pay

## 2020-11-27 VITALS — Ht 65.0 in | Wt 179.0 lb

## 2020-11-27 DIAGNOSIS — Z1211 Encounter for screening for malignant neoplasm of colon: Secondary | ICD-10-CM

## 2020-11-27 MED ORDER — PLENVU 140 G PO SOLR: 1.0000 | ORAL | 0 refills | Status: DC

## 2020-11-27 NOTE — Progress Notes (Signed)
No allergies to soy or egg Pt is not on blood thinners or diet pills Denies issues with sedation/intubation-- but has had procedure for removal of wisdom teeth Denies atrial flutter/fib Denies constipation   Emmi instructions given to pt  Pt is aware of Covid safety and care partner requirements.

## 2020-12-06 ENCOUNTER — Encounter: Payer: Self-pay | Admitting: Gastroenterology

## 2020-12-14 ENCOUNTER — Telehealth: Payer: Self-pay | Admitting: Gastroenterology

## 2020-12-14 NOTE — Telephone Encounter (Signed)
Noted.  Screening exam

## 2020-12-14 NOTE — Telephone Encounter (Signed)
noted 

## 2020-12-14 NOTE — Telephone Encounter (Addendum)
Good afternoon,  Patient cancelled appointment for 12/18/20 at 2:00pm with Dr. Myrtie Neither.  Patient states she has no family member to come with her. She will call back to schedule when someone is able to come.  Thank you

## 2020-12-18 ENCOUNTER — Encounter: Payer: BC Managed Care – PPO | Admitting: Gastroenterology

## 2021-02-12 ENCOUNTER — Encounter: Payer: Self-pay | Admitting: Gastroenterology

## 2021-02-12 ENCOUNTER — Ambulatory Visit (AMBULATORY_SURGERY_CENTER): Payer: BC Managed Care – PPO | Admitting: Gastroenterology

## 2021-02-12 ENCOUNTER — Other Ambulatory Visit: Payer: Self-pay

## 2021-02-12 VITALS — BP 126/75 | HR 77 | Temp 98.9°F | Resp 16 | Ht 65.0 in | Wt 179.0 lb

## 2021-02-12 DIAGNOSIS — Z1211 Encounter for screening for malignant neoplasm of colon: Secondary | ICD-10-CM

## 2021-02-12 MED ORDER — SODIUM CHLORIDE 0.9 % IV SOLN: 500.0000 mL | Freq: Once | INTRAVENOUS | Status: DC

## 2021-02-12 NOTE — Progress Notes (Signed)
VS by CW  Pt's states no medical or surgical changes since previsit or office visit.  

## 2021-02-12 NOTE — Op Note (Signed)
[facility] Patient Name: Renee Russell Procedure Date: 02/12/2021 1:32 PM MRN: [id number] Endoscopist: Sherilyn Cooter L. Myrtie Neither , MD Age: 50 Referring MD:  Date of Birth: [birth year] Gender: Female Account #: [id number] Procedure:                Colonoscopy Indications:              Screening for colorectal malignant neoplasm, This                            is the patient's first colonoscopy Medicines:                Monitored Anesthesia Care Procedure:                Pre-Anesthesia Assessment:                           - Prior to the procedure, a History and Physical                            was performed, and patient medications and                            allergies were reviewed. The patient's tolerance of                            previous anesthesia was also reviewed. The risks                            and benefits of the procedure and the sedation                            options and risks were discussed with the patient.                            All questions were answered, and informed consent                            was obtained. Prior Anticoagulants: The patient has                            taken no previous anticoagulant or antiplatelet                            agents. ASA Grade Assessment: II - A patient with                            mild systemic disease. After reviewing the risks                            and benefits, the patient was deemed in                            satisfactory condition to undergo the procedure.  After obtaining informed consent, the colonoscope                            was passed under direct vision. Throughout the                            procedure, the patient's blood pressure, pulse, and                            oxygen saturations were monitored continuously. The                            Olympus CF-[id number]) Colonoscope was                            introduced through the  anus and advanced to the the                            cecum, identified by appendiceal orifice and                            ileocecal valve. The colonoscopy was performed                            without difficulty. The patient tolerated the                            procedure well. The quality of the bowel                            preparation was excellent. The ileocecal valve,                            appendiceal orifice, and rectum were photographed.                            The bowel preparation used was Plenvu. Scope In: 1:34:33 PM Scope Out: 1:47:22 PM Scope Withdrawal Time: 0 hours 9 minutes 56 seconds  Total Procedure Duration: 0 hours 12 minutes 49 seconds  Findings:                 The perianal and digital rectal examinations were                            normal.                           A few small-mouthed diverticula were found in the                            left colon.                           The exam was otherwise without abnormality on  direct and retroflexion views. Complications:            No immediate complications. Estimated Blood Loss:     Estimated blood loss: none. Impression:               - Diverticulosis in the left colon.                           - The examination was otherwise normal on direct                            and retroflexion views.                           - No specimens collected. Recommendation:           - Patient has a contact number available for                            emergencies. The signs and symptoms of potential                            delayed complications were discussed with the                            patient. Return to normal activities tomorrow.                            Written discharge instructions were provided to the                            patient.                           - Resume previous diet.                           - Continue present medications.                            - Repeat colonoscopy in 10 years for screening                            purposes. Loretha Ure L. Myrtie Neither, MD 02/12/2021 1:50:38 PM This report has been signed electronically.

## 2021-02-12 NOTE — Patient Instructions (Signed)
YOU HAD AN ENDOSCOPIC PROCEDURE TODAY AT THE [facility]:   Refer to the procedure report that was given to you for any specific questions about what was found during the examination.  If the procedure report does not answer your questions, please call your gastroenterologist to clarify.  If you requested that your care partner not be given the details of your procedure findings, then the procedure report has been included in a sealed envelope for you to review at your convenience later.  YOU SHOULD EXPECT: Some feelings of bloating in the abdomen. Passage of more gas than usual.  Walking can help get rid of the air that was put into your GI tract during the procedure and reduce the bloating. If you had a lower endoscopy (such as a colonoscopy or flexible sigmoidoscopy) you may notice spotting of blood in your stool or on the toilet paper. If you underwent a bowel prep for your procedure, you may not have a normal bowel movement for a few days.  Please Note:  You might notice some irritation and congestion in your nose or some drainage.  This is from the oxygen used during your procedure.  There is no need for concern and it should clear up in a day or so.  SYMPTOMS TO REPORT IMMEDIATELY:   Following lower endoscopy (colonoscopy or flexible sigmoidoscopy):  Excessive amounts of blood in the stool  Significant tenderness or worsening of abdominal pains  Swelling of the abdomen that is new, acute  Fever of 100F or higher   Following upper endoscopy (EGD)  Vomiting of blood or coffee ground material  New chest pain or pain under the shoulder blades  Painful or persistently difficult swallowing  New shortness of breath  Fever of 100F or higher  Black, tarry-looking stools  For urgent or emergent issues, a gastroenterologist can be reached at any hour by calling [phone]. Do not use MyChart messaging for urgent concerns.    DIET:  We do recommend a small meal at first, but  then you may proceed to your regular diet.  Drink plenty of fluids but you should avoid alcoholic beverages for 24 hours.  ACTIVITY:  You should plan to take it easy for the rest of today and you should NOT DRIVE or use heavy machinery until tomorrow (because of the sedation medicines used during the test).    FOLLOW UP: Our staff will call the number listed on your records 48-72 hours following your procedure to check on you and address any questions or concerns that you may have regarding the information given to you following your procedure. If we do not reach you, we will leave a message.  We will attempt to reach you two times.  During this call, we will ask if you have developed any symptoms of COVID 19. If you develop any symptoms (ie: fever, flu-like symptoms, shortness of breath, cough etc.) before then, please call [phone].  If you test positive for Covid 19 in the 2 weeks post procedure, please call and report this information to us.    If any biopsies were taken you will be contacted by phone or by letter within the next 1-3 weeks.  Please call us at [phone] if you have not heard about the biopsies in 3 weeks.    SIGNATURES/CONFIDENTIALITY: You and/or your care partner have signed paperwork which will be entered into your electronic medical record.  These signatures attest to the fact that that the information above on   your After Visit Summary has been reviewed and is understood.  Full responsibility of the confidentiality of this discharge information lies with you and/or your care-partner. 

## 2021-02-14 ENCOUNTER — Telehealth: Payer: Self-pay

## 2021-02-14 NOTE — Telephone Encounter (Signed)
  Follow up Call-  Call back number 02/12/2021  Post procedure Call Back phone  # [phone]  Permission to leave phone message Yes  Some recent data might be hidden     Patient questions:  Do you have a fever, pain , or abdominal swelling? No. Pain Score  0 *  Have you tolerated food without any problems? Yes.    Have you been able to return to your normal activities? Yes.    Do you have any questions about your discharge instructions: Diet   No. Medications  No. Follow up visit  No.  Do you have questions or concerns about your Care? Yes.    Actions: * If pain score is 4 or above: No action needed, pain <4.   1. Have you developed a fever since your procedure? No   2.   Have you had an respiratory symptoms (SOB or cough) since your procedure? No   3.   Have you tested positive for COVID 19 since your procedure? No   4.   Have you had any family members/close contacts diagnosed with the COVID 19 since your procedure?  No    If yes to any of these questions please route to Laverna Peace, RN and Karlton Lemon, RN

## 2022-07-18 ENCOUNTER — Ambulatory Visit
Admission: RE | Admit: 2022-07-18 | Discharge: 2022-07-18 | Disposition: A | Discharge status: Home / Self Care | Payer: BC Managed Care – PPO | Source: Ambulatory Visit | Attending: Family Medicine | Admitting: Family Medicine

## 2022-07-18 ENCOUNTER — Other Ambulatory Visit: Payer: Self-pay | Admitting: Family Medicine

## 2022-07-18 DIAGNOSIS — M5459 Other low back pain: Secondary | ICD-10-CM

## 2022-09-25 ENCOUNTER — Other Ambulatory Visit: Payer: Self-pay | Admitting: Endocrinology

## 2022-09-25 DIAGNOSIS — E049 Nontoxic goiter, unspecified: Secondary | ICD-10-CM

## 2022-09-27 ENCOUNTER — Ambulatory Visit
Admission: RE | Admit: 2022-09-27 | Discharge: 2022-09-27 | Disposition: A | Discharge status: Home / Self Care | Payer: BC Managed Care – PPO | Source: Ambulatory Visit | Attending: Endocrinology | Admitting: Endocrinology

## 2022-09-27 DIAGNOSIS — E049 Nontoxic goiter, unspecified: Secondary | ICD-10-CM

## 2024-10-06 ENCOUNTER — Other Ambulatory Visit: Payer: Self-pay | Admitting: Endocrinology

## 2024-10-06 DIAGNOSIS — E049 Nontoxic goiter, unspecified: Secondary | ICD-10-CM

## 2024-10-14 ENCOUNTER — Inpatient Hospital Stay: Admission: RE | Admit: 2024-10-14 | Discharge: 2024-10-14 | Attending: Endocrinology | Admitting: Endocrinology

## 2024-10-14 DIAGNOSIS — E049 Nontoxic goiter, unspecified: Secondary | ICD-10-CM
